# Patient Record
Sex: Female | Born: 1980 | Hispanic: No | Marital: Married | Smoking: Never smoker
Health system: Southern US, Community
[De-identification: ages and names within clinical notes are randomized; demographics above are authoritative.]

---

## 2008-08-26 ENCOUNTER — Ambulatory Visit: Payer: Self-pay | Admitting: Advanced Practice Midwife

## 2008-08-26 ENCOUNTER — Inpatient Hospital Stay (HOSPITAL_COMMUNITY): Admission: AD | Admit: 2008-08-26 | Discharge: 2008-08-26 | Payer: Self-pay | Admitting: Gynecology

## 2008-09-29 ENCOUNTER — Ambulatory Visit (HOSPITAL_COMMUNITY): Admission: RE | Admit: 2008-09-29 | Discharge: 2008-09-29 | Payer: Self-pay | Admitting: Family Medicine

## 2008-11-15 ENCOUNTER — Inpatient Hospital Stay (HOSPITAL_COMMUNITY): Admission: AD | Admit: 2008-11-15 | Discharge: 2008-11-16 | Payer: Self-pay | Admitting: Obstetrics and Gynecology

## 2008-11-21 ENCOUNTER — Ambulatory Visit: Payer: Self-pay | Admitting: Obstetrics & Gynecology

## 2008-11-26 ENCOUNTER — Ambulatory Visit: Payer: Self-pay | Admitting: Family

## 2008-11-26 ENCOUNTER — Inpatient Hospital Stay (HOSPITAL_COMMUNITY): Admission: RE | Admit: 2008-11-26 | Discharge: 2008-11-29 | Payer: Self-pay | Admitting: Obstetrics & Gynecology

## 2010-11-26 ENCOUNTER — Inpatient Hospital Stay (HOSPITAL_COMMUNITY)
Admission: AD | Admit: 2010-11-26 | Discharge: 2010-11-26 | Payer: Self-pay | Source: Home / Self Care | Attending: Obstetrics and Gynecology | Admitting: Obstetrics and Gynecology

## 2011-02-21 LAB — URINALYSIS, ROUTINE W REFLEX MICROSCOPIC
Bilirubin Urine: NEGATIVE
Glucose, UA: NEGATIVE mg/dL
Hgb urine dipstick: NEGATIVE
Protein, ur: NEGATIVE mg/dL
Specific Gravity, Urine: 1.025 (ref 1.005–1.030)
pH: 6 (ref 5.0–8.0)

## 2011-04-09 ENCOUNTER — Inpatient Hospital Stay (HOSPITAL_COMMUNITY): Payer: Medicaid Other

## 2011-04-09 ENCOUNTER — Inpatient Hospital Stay (HOSPITAL_COMMUNITY)
Admission: AD | Admit: 2011-04-09 | Discharge: 2011-04-09 | Disposition: A | Discharge status: Home / Self Care | Payer: Medicaid Other | Source: Ambulatory Visit | Attending: Obstetrics and Gynecology | Admitting: Obstetrics and Gynecology

## 2011-04-09 DIAGNOSIS — O4100X0 Oligohydramnios, unspecified trimester, not applicable or unspecified: Secondary | ICD-10-CM | POA: Insufficient documentation

## 2011-05-01 ENCOUNTER — Inpatient Hospital Stay (HOSPITAL_COMMUNITY)
Admission: AD | Admit: 2011-05-01 | Discharge: 2011-05-01 | Disposition: A | Discharge status: Home / Self Care | Payer: Medicaid Other | Source: Ambulatory Visit | Attending: Obstetrics and Gynecology | Admitting: Obstetrics and Gynecology

## 2011-05-01 DIAGNOSIS — O479 False labor, unspecified: Secondary | ICD-10-CM | POA: Insufficient documentation

## 2011-05-05 ENCOUNTER — Inpatient Hospital Stay (HOSPITAL_COMMUNITY)
Admission: RE | Admit: 2011-05-05 | Discharge: 2011-05-07 | DRG: 767 | Disposition: A | Discharge status: Home / Self Care | Payer: Medicaid Other | Source: Ambulatory Visit | Attending: Obstetrics and Gynecology | Admitting: Obstetrics and Gynecology

## 2011-05-05 DIAGNOSIS — O9903 Anemia complicating the puerperium: Secondary | ICD-10-CM | POA: Diagnosis not present

## 2011-05-05 DIAGNOSIS — D649 Anemia, unspecified: Secondary | ICD-10-CM | POA: Diagnosis not present

## 2011-05-05 LAB — CBC
HCT: 29.9 % — ABNORMAL LOW (ref 36.0–46.0)
MCH: 25.5 pg — ABNORMAL LOW (ref 26.0–34.0)
MCH: 24.9 pg — ABNORMAL LOW (ref 26.0–34.0)
MCHC: 31.7 g/dL (ref 30.0–36.0)
MCHC: 31.4 g/dL (ref 30.0–36.0)
MCV: 80.7 fL (ref 78.0–100.0)
MCV: 79.3 fL (ref 78.0–100.0)
Platelets: 125 10*3/uL — ABNORMAL LOW (ref 150–400)
RBC: 4.19 MIL/uL (ref 3.87–5.11)
WBC: 12.5 10*3/uL — ABNORMAL HIGH (ref 4.0–10.5)

## 2011-05-06 ENCOUNTER — Other Ambulatory Visit: Payer: Self-pay | Admitting: Obstetrics and Gynecology

## 2011-05-06 LAB — CBC
HCT: 23.6 % — ABNORMAL LOW (ref 36.0–46.0)
Hemoglobin: 7.6 g/dL — ABNORMAL LOW (ref 12.0–15.0)
Hemoglobin: 7.1 g/dL — ABNORMAL LOW (ref 12.0–15.0)
MCH: 25.5 pg — ABNORMAL LOW (ref 26.0–34.0)
MCH: 26.7 pg (ref 26.0–34.0)
MCHC: 32.2 g/dL (ref 30.0–36.0)
MCV: 79.2 fL (ref 78.0–100.0)
RBC: 2.98 MIL/uL — ABNORMAL LOW (ref 3.87–5.11)
RDW: 16.3 % — ABNORMAL HIGH (ref 11.5–15.5)
WBC: 11.2 10*3/uL — ABNORMAL HIGH (ref 4.0–10.5)
WBC: 12.8 10*3/uL — ABNORMAL HIGH (ref 4.0–10.5)
WBC: 13.1 10*3/uL — ABNORMAL HIGH (ref 4.0–10.5)

## 2011-05-07 LAB — CBC
HCT: 19.7 % — ABNORMAL LOW (ref 36.0–46.0)
HCT: 22.8 % — ABNORMAL LOW (ref 36.0–46.0)
Hemoglobin: 7.4 g/dL — ABNORMAL LOW (ref 12.0–15.0)
MCH: 26.3 pg (ref 26.0–34.0)
MCHC: 32.5 g/dL (ref 30.0–36.0)
MCHC: 32.5 g/dL (ref 30.0–36.0)
MCV: 80.6 fL (ref 78.0–100.0)
Platelets: 100 10*3/uL — ABNORMAL LOW (ref 150–400)
RBC: 2.43 MIL/uL — ABNORMAL LOW (ref 3.87–5.11)
RBC: 2.83 MIL/uL — ABNORMAL LOW (ref 3.87–5.11)

## 2011-05-08 LAB — CROSSMATCH
Unit division: 0
Unit division: 0

## 2011-05-14 NOTE — Op Note (Signed)
Angela Sweeney, [place] NO.:  [id number]  MEDICAL RECORD NO.:  [id number]           PATIENT TYPE:  I  LOCATION:  9104                          FACILITY:  [facility]  PHYSICIAN:  Juluis Mire, M.D.   DATE OF BIRTH:  1981-11-30  DATE OF PROCEDURE:  05/06/2011 DATE OF DISCHARGE:                              OPERATIVE REPORT   PREOPERATIVE DIAGNOSIS:  Post partum hemorrhage.  POSTOPERATIVE DIAGNOSIS:  Retained products.  OPERATIVE PROCEDURE:  Uterine exploration with uterine curettage.  SURGEON:  Juluis Mire, MD  ANESTHESIA:  Spinal.  ESTIMATED BLOOD LOSS:  200 mL.  Packs were none.  DRAINS:  Urethral Foley.  We had not given her any intraoperative blood replacement.  There were no complications.  INDICATIONS:  The patient is a 30 year old gravida 3, now para 3 female who delivered on the afternoon of the 24th at about 2:00 p.m.  She did have vaginal bleeding after delivery.  We obtained a hemoglobin, was 9.4.  This eventually respond to Pitocin, IM Methergine and Cytotec. She was sent out to the floor.  Somewhere between 10:30 to 11, it was reported that she had had continuous bleeding out on the floor.  She was not continued on Methergine as ordered.  We went to evaluate the patient.  She was having fairly continuous bleeding, although was not exactly hemorrhaging.  Her hemoglobin was repeated, it was 6.4.  She seemed stable.  We decided to proceed with a D and C for possible retained products.  The risks were explained to the patient and her husband including the risk of infection.  Risk of continued bleeding that could require transfusion with the risk of AIDS or hepatitis. Excessive bleeding could lead to the need for hysterectomy.  There is a risk of uterine perforation leading to injury to adjacent organs requiring exploratory surgery, risk of deep venous thrombosis and pulmonary embolus.  PROCEDURE:  The patient was taken to the OR.  After  satisfactory level of spinal anesthesia was obtained, the patient was placed in dorsal lithotomy position using the Allen stirrups.  Perineum and vagina cleansed out Betadine.  A Foley was placed to straight drain.  Uterine exploration with the hand did reveal multiple clots that were removed. We then placed a weighted speculum in the vaginal vault.  Cervix was grasped with a sponge stick.  We used a large curette and curetted the intrauterine cavity.  Moderate clots were obtained.  We then manually explored the uterine cavity.  She had adherent placenta anteriorly.  It may have been partially an accreta.  Portions of placental tissue were removed manually.  We continued this along with curetting at least on manual exam, the intrauterine cavity felt clear.  She had been given Hemabate as well as IM Methergine and the uterus now began to contract down well.  Further exploration of the uterus did not reveal any additional tissue manually or with curetting.  There was no signs of perforation or complications.  The speculum as well as the ring forceps were removed.  We observed the patient. She had some still trickling of blood,  but no excessive bleeding and the uterus remained firm.  She did get a second dose of Hemabate.  She was taken out of dorsal lithotomy position.  Transferred to recovery room in good condition.  Sponge, instrument, and needle count was correct by circulating nurse.     Juluis Mire, M.D.     JSM/MEDQ  D:  05/06/2011  T:  05/06/2011  Job:  [id number]  Electronically Signed by Richardean Chimera M.D. on 05/14/2011 06:16:18 AM

## 2011-05-25 ENCOUNTER — Inpatient Hospital Stay (HOSPITAL_COMMUNITY)
Admission: AD | Admit: 2011-05-25 | Discharge: 2011-05-25 | Disposition: A | Discharge status: Home / Self Care | Payer: Medicaid Other | Source: Ambulatory Visit | Attending: Obstetrics and Gynecology | Admitting: Obstetrics and Gynecology

## 2011-05-25 ENCOUNTER — Other Ambulatory Visit: Payer: Self-pay | Admitting: Obstetrics and Gynecology

## 2011-05-25 DIAGNOSIS — O9122 Nonpurulent mastitis associated with the puerperium: Secondary | ICD-10-CM

## 2011-05-25 LAB — DIFFERENTIAL
Eosinophils Relative: 1 % (ref 0–5)
Monocytes Relative: 7 % (ref 3–12)
Neutro Abs: 5 10*3/uL (ref 1.7–7.7)

## 2011-05-25 LAB — URINALYSIS, ROUTINE W REFLEX MICROSCOPIC
Bilirubin Urine: NEGATIVE
Glucose, UA: NEGATIVE mg/dL
Ketones, ur: NEGATIVE mg/dL
Leukocytes, UA: NEGATIVE
Nitrite: NEGATIVE
Protein, ur: NEGATIVE mg/dL
Specific Gravity, Urine: >1.03 — ABNORMAL HIGH (ref 1.005–1.030)
Urobilinogen, UA: 0.2 mg/dL (ref 0.0–1.0)

## 2011-05-25 LAB — CBC: MCV: 79.8 fL (ref 78.0–100.0)

## 2011-05-25 LAB — URINE MICROSCOPIC-ADD ON

## 2011-09-12 LAB — CBC
HCT: 31 — ABNORMAL LOW
Hemoglobin: 10.2 — ABNORMAL LOW
MCV: 78.8
RDW: 15.3

## 2011-09-12 LAB — COMPREHENSIVE METABOLIC PANEL
Alkaline Phosphatase: 41
BUN: 9
Creatinine, Ser: 0.34 — ABNORMAL LOW
Glucose, Bld: 89
Potassium: 3.7
Total Bilirubin: 0.5
Total Protein: 6.1

## 2011-09-12 LAB — URINALYSIS, ROUTINE W REFLEX MICROSCOPIC
Hgb urine dipstick: NEGATIVE
Protein, ur: NEGATIVE
Specific Gravity, Urine: >1.03 — ABNORMAL HIGH
Urobilinogen, UA: 0.2
pH: 6

## 2011-09-12 LAB — URINE MICROSCOPIC-ADD ON

## 2011-09-16 LAB — RPR: RPR Ser Ql: NONREACTIVE

## 2011-09-16 LAB — CBC
HCT: 23.1 % — ABNORMAL LOW (ref 36.0–46.0)
Hemoglobin: 11.6 g/dL — ABNORMAL LOW (ref 12.0–15.0)
Hemoglobin: 7.9 g/dL — CL (ref 12.0–15.0)
MCV: 78.9 fL (ref 78.0–100.0)
RBC: 4.46 MIL/uL (ref 3.87–5.11)
WBC: 10.1 10*3/uL (ref 4.0–10.5)
WBC: 8.6 10*3/uL (ref 4.0–10.5)

## 2012-09-29 ENCOUNTER — Emergency Department (HOSPITAL_COMMUNITY)
Admission: EM | Admit: 2012-09-29 | Discharge: 2012-09-29 | Disposition: A | Discharge status: Home / Self Care | Payer: Medicaid Other | Attending: Emergency Medicine | Admitting: Emergency Medicine

## 2012-09-29 ENCOUNTER — Encounter (HOSPITAL_COMMUNITY): Payer: Self-pay | Admitting: Emergency Medicine

## 2012-09-29 DIAGNOSIS — J029 Acute pharyngitis, unspecified: Secondary | ICD-10-CM

## 2012-09-29 MED ORDER — AMOXICILLIN 500 MG PO CAPS
1000.0000 mg | ORAL_CAPSULE | Freq: Once | ORAL | Status: AC
  Administered 2012-09-29: 1000 mg via ORAL
  Filled 2012-09-29: qty 2

## 2012-09-29 MED ORDER — PREDNISONE 20 MG PO TABS
60.0000 mg | ORAL_TABLET | Freq: Once | ORAL | Status: AC
  Administered 2012-09-29: 60 mg via ORAL
  Filled 2012-09-29: qty 3

## 2012-09-29 MED ORDER — OXYCODONE-ACETAMINOPHEN 5-325 MG PO TABS: 1.0000 | ORAL_TABLET | ORAL | Status: DC | PRN

## 2012-09-29 MED ORDER — PREDNISONE 50 MG PO TABS: 50.0000 mg | ORAL_TABLET | Freq: Every day | ORAL | Status: DC

## 2012-09-29 MED ORDER — AMOXICILLIN 500 MG PO CAPS: 1000.0000 mg | ORAL_CAPSULE | Freq: Two times a day (BID) | ORAL | Status: DC

## 2012-09-29 MED ORDER — AMOXICILLIN 500 MG PO CAPS
500.0000 mg | ORAL_CAPSULE | Freq: Once | ORAL | Status: AC
  Administered 2012-09-29: 500 mg via ORAL
  Filled 2012-09-29: qty 1

## 2012-09-29 NOTE — ED Notes (Signed)
Pt has taken ibuprofen, denies cough, fevers, nausea, vomiting, diarrhea

## 2012-09-29 NOTE — ED Notes (Signed)
Pt c/o sore throat x 1 week

## 2012-09-29 NOTE — ED Provider Notes (Signed)
History  This chart was scribed for Dione Booze, MD by Bennett Scrape. This patient was seen in room TR06C/TR06C and the patient's care was started at 11:40AM.  CSN: [id number]  Arrival date & time 09/29/12  1045   First MD Initiated Contact with Patient 09/29/12 1140      Chief Complaint  Patient presents with  . Sore Throat    The history is provided by the patient. No language interpreter was used.    Nyhla Sweeney is a 31 y.o. female who presents to the Emergency Department complaining of two weeks of gradual onset, gradually worsening, constant sore throat with associated left otalgia and 2 days of left sided tongue swelling. She rates her pain an 8 out of 10 and states that the pain is worse with swallowing. She reports taking acetaminophen with mild improvement in her pain. She denies fever, chills, nausea, emesis and cough as associated symptoms. She does not have a h/o chronic medical conditions and denies smoking and alcohol use.  No PCP currently.  History reviewed. No pertinent past medical history.  History reviewed. No pertinent past surgical history.  History reviewed. No pertinent family history.  History  Substance Use Topics  . Smoking status: Never Smoker   . Smokeless tobacco: Not on file  . Alcohol Use: No    No OB history provided.  Review of Systems  Constitutional: Negative for fever and chills.  HENT: Positive for ear pain (left) and sore throat. Negative for congestion.   Respiratory: Negative for cough.   All other systems reviewed and are negative.    Allergies  Review of patient's allergies indicates no known allergies.  Home Medications   Current Outpatient Rx  Name Route Sig Dispense Refill  . ASPIRIN-ACETAMINOPHEN-CAFFEINE 250-250-65 MG PO TABS Oral Take 1 tablet by mouth daily as needed. migraine    . FERROUS SULFATE 325 (65 FE) MG PO TABS Oral Take 325 mg by mouth daily with breakfast.    . IBUPROFEN 200 MG PO TABS Oral Take 600  mg by mouth 2 (two) times daily as needed. pain    . ADULT MULTIVITAMIN W/MINERALS CH Oral Take 1 tablet by mouth daily.      Triage Vitals: BP 111/65  Pulse 83  Temp 99 F (37.2 C) (Oral)  Resp 20  SpO2 100%  Physical Exam  Nursing note and vitals reviewed. Constitutional: She is oriented to person, place, and time. She appears well-developed and well-nourished. No distress.  HENT:  Head: Normocephalic and atraumatic.       Mild edema of the uvula, normal phonation, no difficulty with secretions, TMs are normal bilaterally  Eyes: Conjunctivae normal and EOM are normal.  Neck: Neck supple. No tracheal deviation present.  Cardiovascular: Normal rate.   Pulmonary/Chest: Effort normal. No respiratory distress.  Musculoskeletal: Normal range of motion.  Lymphadenopathy:    She has no cervical adenopathy.  Neurological: She is alert and oriented to person, place, and time.  Skin: Skin is warm and dry.  Psychiatric: She has a normal mood and affect. Her behavior is normal.    ED Course  Procedures (including critical care time)  DIAGNOSTIC STUDIES: Oxygen Saturation is 100% on room air, normal by my interpretation.    COORDINATION OF CARE: 11:44AM-Informed pt of negative strep test. Discussed discharge plan which includes Amoxicillin, percocet and prednisone with pt at bedside and pt agreed to plan.  12:00PM-Ordered 60 mg prednisone and 1,000 mg Amoxicillin   Results for orders placed during the  hospital encounter of 09/29/12  RAPID STREP SCREEN      Component Value Range   Streptococcus, Group A Screen (Direct) NEGATIVE  NEGATIVE    1. Pharyngitis       MDM  Pharyngitis oh which has been present for 2 weeks. Because of longevity of symptoms, she'll be started on antibiotics in spite of negative strep infection. It should be noted, that there are other bacteria that can cause pharyngitis besides strep. She'll be sent home with prescription for prednisone, amoxicillin,  and Percocet.   I personally performed the services described in this documentation, which was scribed in my presence. The recorded information has been reviewed and considered.         Dione Booze, MD 09/29/12 1330

## 2012-11-13 ENCOUNTER — Encounter (HOSPITAL_COMMUNITY): Payer: Self-pay | Admitting: *Deleted

## 2012-11-13 ENCOUNTER — Emergency Department (HOSPITAL_COMMUNITY)
Admission: EM | Admit: 2012-11-13 | Discharge: 2012-11-13 | Disposition: A | Discharge status: Home / Self Care | Payer: Medicaid Other | Attending: Emergency Medicine | Admitting: Emergency Medicine

## 2012-11-13 DIAGNOSIS — N390 Urinary tract infection, site not specified: Secondary | ICD-10-CM | POA: Insufficient documentation

## 2012-11-13 DIAGNOSIS — R51 Headache: Secondary | ICD-10-CM | POA: Insufficient documentation

## 2012-11-13 DIAGNOSIS — R5383 Other fatigue: Secondary | ICD-10-CM | POA: Insufficient documentation

## 2012-11-13 DIAGNOSIS — R110 Nausea: Secondary | ICD-10-CM | POA: Insufficient documentation

## 2012-11-13 DIAGNOSIS — Z3202 Encounter for pregnancy test, result negative: Secondary | ICD-10-CM | POA: Insufficient documentation

## 2012-11-13 DIAGNOSIS — Z79899 Other long term (current) drug therapy: Secondary | ICD-10-CM | POA: Insufficient documentation

## 2012-11-13 DIAGNOSIS — R5381 Other malaise: Secondary | ICD-10-CM | POA: Insufficient documentation

## 2012-11-13 DIAGNOSIS — D649 Anemia, unspecified: Secondary | ICD-10-CM | POA: Insufficient documentation

## 2012-11-13 LAB — URINALYSIS, ROUTINE W REFLEX MICROSCOPIC
Glucose, UA: NEGATIVE mg/dL
Hgb urine dipstick: NEGATIVE
Specific Gravity, Urine: 1.033 — ABNORMAL HIGH (ref 1.005–1.030)
pH: 5 (ref 5.0–8.0)

## 2012-11-13 LAB — CBC WITH DIFFERENTIAL/PLATELET
Eosinophils Relative: 2 % (ref 0–5)
HCT: 32.1 % — ABNORMAL LOW (ref 36.0–46.0)
Hemoglobin: 10.1 g/dL — ABNORMAL LOW (ref 12.0–15.0)
Lymphocytes Relative: 38 % (ref 12–46)
Lymphs Abs: 2.5 10*3/uL (ref 0.7–4.0)
MCV: 72.1 fL — ABNORMAL LOW (ref 78.0–100.0)
Monocytes Absolute: 0.4 10*3/uL (ref 0.1–1.0)
Platelets: 197 10*3/uL (ref 150–400)
RBC: 4.45 MIL/uL (ref 3.87–5.11)
WBC: 6.4 10*3/uL (ref 4.0–10.5)

## 2012-11-13 LAB — URINE MICROSCOPIC-ADD ON

## 2012-11-13 LAB — BASIC METABOLIC PANEL
CO2: 26 mEq/L (ref 19–32)
Calcium: 9.3 mg/dL (ref 8.4–10.5)
Glucose, Bld: 84 mg/dL (ref 70–99)
Sodium: 137 mEq/L (ref 135–145)

## 2012-11-13 MED ORDER — CENTRUM PO CHEW: 1.0000 | CHEWABLE_TABLET | Freq: Every day | ORAL | Status: AC

## 2012-11-13 MED ORDER — SULFAMETHOXAZOLE-TRIMETHOPRIM 800-160 MG PO TABS: 1.0000 | ORAL_TABLET | Freq: Two times a day (BID) | ORAL | Status: AC

## 2012-11-13 MED ORDER — ACETAMINOPHEN 325 MG PO TABS
650.0000 mg | ORAL_TABLET | Freq: Once | ORAL | Status: AC
  Administered 2012-11-13: 650 mg via ORAL
  Filled 2012-11-13: qty 2

## 2012-11-13 NOTE — ED Provider Notes (Signed)
History     CSN: [id number]  Arrival date & time 11/13/12  1735   First MD Initiated Contact with Patient 11/13/12 1929      Chief Complaint  Patient presents with  . Dizziness  . Headache  . Nausea    (Consider location/radiation/quality/duration/timing/severity/associated sxs/prior treatment) HPI Comments: Patient is a 31 year old female with no significant past medical history who presents with a 3 week history of fatigue. Patient reports the fatigue starting gradually and progressively worsening since the onset. No alleviating/aggravating factors. Patient reports associated occasional dizziness, headache, and nausea. Her LMP started yesterday. She has experienced this before and was told she has low iron. She was given iron supplements in the past which provided some improvement. She does not have a PCP.    History reviewed. No pertinent past medical history.  History reviewed. No pertinent past surgical history.  No family history on file.  History  Substance Use Topics  . Smoking status: Never Smoker   . Smokeless tobacco: Not on file  . Alcohol Use: No    OB History    Grav Para Term Preterm Abortions TAB SAB Ect Mult Living                  Review of Systems  Constitutional: Positive for fatigue.  Gastrointestinal: Positive for nausea.  Neurological: Positive for headaches.  All other systems reviewed and are negative.    Allergies  Review of patient's allergies indicates no known allergies.  Home Medications   Current Outpatient Rx  Name  Route  Sig  Dispense  Refill  . ASPIRIN-ACETAMINOPHEN-CAFFEINE 250-250-65 MG PO TABS   Oral   Take 1 tablet by mouth daily as needed. migraine         . FERROUS SULFATE 325 (65 FE) MG PO TABS   Oral   Take 325 mg by mouth daily with breakfast.         . IBUPROFEN 200 MG PO TABS   Oral   Take 600 mg by mouth 2 (two) times daily as needed. pain         . ADULT MULTIVITAMIN W/MINERALS CH   Oral   Take 1  tablet by mouth daily.           BP 91/58  Pulse 77  Temp 97.6 F (36.4 C) (Oral)  Resp 18  SpO2 99%  LMP 11/05/2012  Physical Exam  Nursing note and vitals reviewed. Constitutional: She is oriented to person, place, and time. She appears well-developed and well-nourished. No distress.  HENT:  Head: Normocephalic and atraumatic.  Mouth/Throat: No oropharyngeal exudate.  Eyes: Conjunctivae normal and EOM are normal. Pupils are equal, round, and reactive to light. No scleral icterus.       No pallor of conjunctiva noted.   Neck: Normal range of motion. Neck supple.  Cardiovascular: Normal rate and regular rhythm.  Exam reveals no gallop and no friction rub.   No murmur heard. Pulmonary/Chest: Effort normal and breath sounds normal. She has no wheezes. She has no rales. She exhibits no tenderness.  Abdominal: Soft. She exhibits no distension. There is no tenderness. There is no rebound and no guarding.  Musculoskeletal: Normal range of motion.  Neurological: She is alert and oriented to person, place, and time. Coordination normal.       Speech is goal-oriented. Moves limbs without ataxia.   Skin: Skin is warm and dry. She is not diaphoretic.  Psychiatric: She has a normal mood and affect.  Her behavior is normal.    ED Course  Procedures (including critical care time)  Labs Reviewed  URINALYSIS, ROUTINE W REFLEX MICROSCOPIC - Abnormal; Notable for the following:    APPearance CLOUDY (*)     Specific Gravity, Urine 1.033 (*)     Leukocytes, UA MODERATE (*)     All other components within normal limits  CBC WITH DIFFERENTIAL - Abnormal; Notable for the following:    Hemoglobin 10.1 (*)     HCT 32.1 (*)     MCV 72.1 (*)     MCH 22.7 (*)     RDW 16.9 (*)     All other components within normal limits  URINE MICROSCOPIC-ADD ON - Abnormal; Notable for the following:    Squamous Epithelial / LPF MANY (*)     Bacteria, UA MANY (*)     All other components within normal limits   BASIC METABOLIC PANEL  POCT PREGNANCY, URINE  URINE CULTURE   No results found.   1. UTI (urinary tract infection)   2. Anemia       MDM  8:09 PM Labs show anemia and UTI. Patient will be discharged with Bactrim for UTI, tylenol for headache, and iron supplements. No further evaluation needed at this time. I will recommend a PCP for the patient to follow up with.         Emilia Beck, PA-C 11/15/12 1212

## 2012-11-13 NOTE — ED Notes (Signed)
Pt is here with dizziness for 3 weeks, headache, fatigue,and nausea.  Constipation.  Last menstral yesterday

## 2012-11-15 LAB — URINE CULTURE

## 2012-11-16 NOTE — ED Notes (Signed)
+   Urine Chart appended per protocol MD. 

## 2012-11-16 NOTE — ED Provider Notes (Signed)
Medical screening examination/treatment/procedure(s) were performed by non-physician practitioner and as supervising physician I was immediately available for consultation/collaboration.   Charles B. Bernette Mayers, MD 11/16/12 [phone]

## 2012-11-18 ENCOUNTER — Telehealth (HOSPITAL_COMMUNITY): Payer: Self-pay | Admitting: Emergency Medicine

## 2012-11-18 NOTE — ED Notes (Signed)
Unable to contact patient via phone. Sent letter. °

## 2012-11-18 NOTE — ED Notes (Signed)
Chart returned from EDP office. Prescribed Amoxicillin 500 mg PO BID x 10 days. #30. Prescribed by Teressa Lower NP.

## 2012-12-16 NOTE — ED Notes (Signed)
Unable to contact patient via phone or letter. Chart sent to Medical Records. °

## 2012-12-17 ENCOUNTER — Emergency Department (HOSPITAL_COMMUNITY)
Admission: EM | Admit: 2012-12-17 | Discharge: 2012-12-17 | Discharge status: Against Medical Advice | Payer: Medicaid Other | Attending: Emergency Medicine | Admitting: Emergency Medicine

## 2012-12-17 ENCOUNTER — Encounter (HOSPITAL_COMMUNITY): Payer: Self-pay | Admitting: Emergency Medicine

## 2012-12-17 DIAGNOSIS — R509 Fever, unspecified: Secondary | ICD-10-CM | POA: Insufficient documentation

## 2012-12-17 DIAGNOSIS — J029 Acute pharyngitis, unspecified: Secondary | ICD-10-CM | POA: Insufficient documentation

## 2012-12-17 LAB — RAPID STREP SCREEN (MED CTR MEBANE ONLY): Streptococcus, Group A Screen (Direct): POSITIVE — AB

## 2012-12-17 MED ORDER — ACETAMINOPHEN 325 MG PO TABS
ORAL_TABLET | ORAL | Status: AC
  Filled 2012-12-17: qty 2

## 2012-12-17 MED ORDER — ACETAMINOPHEN 325 MG PO TABS
ORAL_TABLET | ORAL | Status: AC
  Filled 2012-12-17: qty 1

## 2012-12-17 MED ORDER — ACETAMINOPHEN 325 MG PO TABS
650.0000 mg | ORAL_TABLET | Freq: Once | ORAL | Status: AC
  Administered 2012-12-17: 650 mg via ORAL

## 2012-12-17 NOTE — ED Notes (Signed)
Called x2 in waiting room; no answer.  Will try again.

## 2012-12-17 NOTE — ED Notes (Signed)
Pt here with fever and sore throat x 2 days.  

## 2014-12-04 ENCOUNTER — Encounter (HOSPITAL_COMMUNITY): Payer: Self-pay | Admitting: *Deleted

## 2014-12-04 ENCOUNTER — Emergency Department (INDEPENDENT_AMBULATORY_CARE_PROVIDER_SITE_OTHER)
Admission: EM | Admit: 2014-12-04 | Discharge: 2014-12-04 | Disposition: A | Discharge status: Home / Self Care | Payer: Medicaid Other | Source: Home / Self Care | Attending: Emergency Medicine | Admitting: Emergency Medicine

## 2014-12-04 DIAGNOSIS — J029 Acute pharyngitis, unspecified: Secondary | ICD-10-CM

## 2014-12-04 DIAGNOSIS — J069 Acute upper respiratory infection, unspecified: Secondary | ICD-10-CM

## 2014-12-04 LAB — POCT RAPID STREP A: STREPTOCOCCUS, GROUP A SCREEN (DIRECT): NEGATIVE

## 2014-12-04 NOTE — ED Notes (Signed)
Pt   Reports  Symptoms  Of  Congestion    /  Cough         Nasal  stuffyness     With  A  sorethroat  For  sev  Days     she is sitting  Upright on the  Exam table  Speaking in  Complete  sentances  And  Appearing in no acute  Distress

## 2014-12-04 NOTE — ED Provider Notes (Signed)
CSN: [id number]     Arrival date & time 12/04/14  1332 History   First MD Initiated Contact with Patient 12/04/14 1344     Chief Complaint  Patient presents with  . URI   (Consider location/radiation/quality/duration/timing/severity/associated sxs/prior Treatment) Patient is a 33 y.o. female presenting with URI. The history is provided by the patient.  URI Presenting symptoms: congestion, cough, rhinorrhea and sore throat   Presenting symptoms: no fever   Severity:  Mild Onset quality:  Gradual Duration:  6 days Timing:  Constant Chronicity:  New   History reviewed. No pertinent past medical history. History reviewed. No pertinent past surgical history. No family history on file. History  Substance Use Topics  . Smoking status: Never Smoker   . Smokeless tobacco: Not on file  . Alcohol Use: No   OB History    No data available     Review of Systems  Constitutional: Negative for fever.  HENT: Positive for congestion, rhinorrhea and sore throat.   Respiratory: Positive for cough. Negative for shortness of breath.   Cardiovascular: Negative.   Gastrointestinal: Negative.   Skin: Negative.     Allergies  Review of patient's allergies indicates no known allergies.  Home Medications   Prior to Admission medications   Medication Sig Start Date End Date Taking? Authorizing Provider  aspirin-acetaminophen-caffeine (EXCEDRIN MIGRAINE) [phone] MG per tablet Take 1 tablet by mouth daily as needed. migraine    Historical Provider, MD  ferrous sulfate 325 (65 FE) MG tablet Take 325 mg by mouth daily with breakfast.    Historical Provider, MD  ibuprofen (ADVIL,MOTRIN) 200 MG tablet Take 600 mg by mouth 2 (two) times daily as needed. pain    Historical Provider, MD  Multiple Vitamin (MULTIVITAMIN WITH MINERALS) TABS Take 1 tablet by mouth daily.    Historical Provider, MD  multivitamin-iron-minerals-folic acid (CENTRUM) chewable tablet Chew 1 tablet by mouth daily. 11/13/12    Kaitlyn Szekalski, PA-C   BP 106/64 mmHg  Pulse 82  Temp(Src) 97.8 F (36.6 C) (Oral)  Resp 16  SpO2 97% Physical Exam  Constitutional: She is oriented to person, place, and time. She appears well-developed and well-nourished. No distress.  HENT:  Head: Normocephalic and atraumatic.  Right Ear: Hearing, tympanic membrane, external ear and ear canal normal.  Left Ear: Hearing, tympanic membrane, external ear and ear canal normal.  Nose: Nose normal.  Mouth/Throat: Uvula is midline and mucous membranes are normal. Posterior oropharyngeal erythema present.  Eyes: Conjunctivae are normal. No scleral icterus.  Neck: Normal range of motion. Neck supple.  Cardiovascular: Normal rate, regular rhythm and normal heart sounds.   Pulmonary/Chest: Effort normal and breath sounds normal.  Musculoskeletal: Normal range of motion.  Lymphadenopathy:    She has no cervical adenopathy.  Neurological: She is alert and oriented to person, place, and time.  Skin: Skin is warm and dry.  Psychiatric: She has a normal mood and affect. Her behavior is normal.  Nursing note and vitals reviewed.   ED Course  Procedures (including critical care time) Labs Review Labs Reviewed  POCT RAPID STREP A ([facility] CARE ONLY)    Imaging Review No results found.   MDM   1. URI (upper respiratory infection)   2. Pharyngitis   Rapid strep negative. Exam consistent with mild URI. Advised symptomatic care at home.   Ria ClockJennifer Lee H Starlena Beil, [place]PA 12/04/14 1501

## 2014-12-04 NOTE — Discharge Instructions (Signed)
Rapid strep test was negative. tylenol as directed on packaging for pain or fever Delsym as directed on packaging for cough. Follow up with your doctor if no improvement over the next 3-4 days.  Pharyngitis Pharyngitis is redness, pain, and swelling (inflammation) of your pharynx.  CAUSES  Pharyngitis is usually caused by infection. Most of the time, these infections are from viruses (viral) and are part of a cold. However, sometimes pharyngitis is caused by bacteria (bacterial). Pharyngitis can also be caused by allergies. Viral pharyngitis may be spread from person to person by coughing, sneezing, and personal items or utensils (cups, forks, spoons, toothbrushes). Bacterial pharyngitis may be spread from person to person by more intimate contact, such as kissing.  SIGNS AND SYMPTOMS  Symptoms of pharyngitis include:   Sore throat.   Tiredness (fatigue).   Low-grade fever.   Headache.  Joint pain and muscle aches.  Skin rashes.  Swollen lymph nodes.  Plaque-like film on throat or tonsils (often seen with bacterial pharyngitis). DIAGNOSIS  Your health care provider will ask you questions about your illness and your symptoms. Your medical history, along with a physical exam, is often all that is needed to diagnose pharyngitis. Sometimes, a rapid strep test is done. Other lab tests may also be done, depending on the suspected cause.  TREATMENT  Viral pharyngitis will usually get better in 3-4 days without the use of medicine. Bacterial pharyngitis is treated with medicines that kill germs (antibiotics).  HOME CARE INSTRUCTIONS   Drink enough water and fluids to keep your urine clear or pale yellow.   Only take over-the-counter or prescription medicines as directed by your health care provider:   If you are prescribed antibiotics, make sure you finish them even if you start to feel better.   Do not take aspirin.   Get lots of rest.   Gargle with 8 oz of salt water (  tsp of salt per 1 qt of water) as often as every 1-2 hours to soothe your throat.   Throat lozenges (if you are not at risk for choking) or sprays may be used to soothe your throat. SEEK MEDICAL CARE IF:   You have large, tender lumps in your neck.  You have a rash.  You cough up green, yellow-brown, or bloody spit. SEEK IMMEDIATE MEDICAL CARE IF:   Your neck becomes stiff.  You drool or are unable to swallow liquids.  You vomit or are unable to keep medicines or liquids down.  You have severe pain that does not go away with the use of recommended medicines.  You have trouble breathing (not caused by a stuffy nose). MAKE SURE YOU:   Understand these instructions.  Will watch your condition.  Will get help right away if you are not doing well or get worse. Document Released: 11/28/2005 Document Revised: 09/18/2013 Document Reviewed: 08/05/2013 [facility] Patient Information 2015 [place], [place]. This information is not intended to replace advice given to you by your health care provider. Make sure you discuss any questions you have with your health care provider.  Salt Water Gargle This solution will help make your mouth and throat feel better. HOME CARE INSTRUCTIONS   Mix 1 teaspoon of salt in 8 ounces of warm water.  Gargle with this solution as much or often as you need or as directed. Swish and gargle gently if you have any sores or wounds in your mouth.  Do not swallow this mixture. Document Released: 09/01/2004 Document Revised: 02/20/2012 Document Reviewed: 01/23/2009  ExitCare Patient Information 2015 [place], [place]. This information is not intended to replace advice given to you by your health care provider. Make sure you discuss any questions you have with your health care provider.  Sore Throat A sore throat is pain, burning, irritation, or scratchiness of the throat. There is often pain or tenderness when swallowing or talking. A sore throat may be accompanied by  other symptoms, such as coughing, sneezing, fever, and swollen neck glands. A sore throat is often the first sign of another sickness, such as a cold, flu, strep throat, or mononucleosis (commonly known as mono). Most sore throats go away without medical treatment. CAUSES  The most common causes of a sore throat include:  A viral infection, such as a cold, flu, or mono.  A bacterial infection, such as strep throat, tonsillitis, or whooping cough.  Seasonal allergies.  Dryness in the air.  Irritants, such as smoke or pollution.  Gastroesophageal reflux disease (GERD). HOME CARE INSTRUCTIONS   Only take over-the-counter medicines as directed by your caregiver.  Drink enough fluids to keep your urine clear or pale yellow.  Rest as needed.  Try using throat sprays, lozenges, or sucking on hard candy to ease any pain (if older than 4 years or as directed).  Sip warm liquids, such as broth, herbal tea, or warm water with honey to relieve pain temporarily. You may also eat or drink cold or frozen liquids such as frozen ice pops.  Gargle with salt water (mix 1 tsp salt with 8 oz of water).  Do not smoke and avoid secondhand smoke.  Put a cool-mist humidifier in your bedroom at night to moisten the air. You can also turn on a hot shower and sit in the bathroom with the door closed for 5-10 minutes. SEEK IMMEDIATE MEDICAL CARE IF:  You have difficulty breathing.  You are unable to swallow fluids, soft foods, or your saliva.  You have increased swelling in the throat.  Your sore throat does not get better in 7 days.  You have nausea and vomiting.  You have a fever or persistent symptoms for more than 2-3 days.  You have a fever and your symptoms suddenly get worse. MAKE SURE YOU:   Understand these instructions.  Will watch your condition.  Will get help right away if you are not doing well or get worse. Document Released: 01/05/2005 Document Revised: 11/14/2012 Document  Reviewed: 08/05/2012 [facility] Patient Information 2015 [place], [place]. This information is not intended to replace advice given to you by your health care provider. Make sure you discuss any questions you have with your health care provider.  Upper Respiratory Infection, Adult An upper respiratory infection (URI) is also sometimes known as the common cold. The upper respiratory tract includes the nose, sinuses, throat, trachea, and bronchi. Bronchi are the airways leading to the lungs. Most people improve within 1 week, but symptoms can last up to 2 weeks. A residual cough may last even longer.  CAUSES Many different viruses can infect the tissues lining the upper respiratory tract. The tissues become irritated and inflamed and often become very moist. Mucus production is also common. A cold is contagious. You can easily spread the virus to others by oral contact. This includes kissing, sharing a glass, coughing, or sneezing. Touching your mouth or nose and then touching a surface, which is then touched by another person, can also spread the virus. SYMPTOMS  Symptoms typically develop 1 to 3 days after you come in contact  with a cold virus. Symptoms vary from person to person. They may include:  Runny nose.  Sneezing.  Nasal congestion.  Sinus irritation.  Sore throat.  Loss of voice (laryngitis).  Cough.  Fatigue.  Muscle aches.  Loss of appetite.  Headache.  Low-grade fever. DIAGNOSIS  You might diagnose your own cold based on familiar symptoms, since most people get a cold 2 to 3 times a year. Your caregiver can confirm this based on your exam. Most importantly, your caregiver can check that your symptoms are not due to another disease such as strep throat, sinusitis, pneumonia, asthma, or epiglottitis. Blood tests, throat tests, and X-rays are not necessary to diagnose a common cold, but they may sometimes be helpful in excluding other more serious diseases. Your caregiver will  decide if any further tests are required. RISKS AND COMPLICATIONS  You may be at risk for a more severe case of the common cold if you smoke cigarettes, have chronic heart disease (such as heart failure) or lung disease (such as asthma), or if you have a weakened immune system. The very young and very old are also at risk for more serious infections. Bacterial sinusitis, middle ear infections, and bacterial pneumonia can complicate the common cold. The common cold can worsen asthma and chronic obstructive pulmonary disease (COPD). Sometimes, these complications can require emergency medical care and may be life-threatening. PREVENTION  The best way to protect against getting a cold is to practice good hygiene. Avoid oral or hand contact with people with cold symptoms. Wash your hands often if contact occurs. There is no clear evidence that vitamin C, vitamin E, echinacea, or exercise reduces the chance of developing a cold. However, it is always recommended to get plenty of rest and practice good nutrition. TREATMENT  Treatment is directed at relieving symptoms. There is no cure. Antibiotics are not effective, because the infection is caused by a virus, not by bacteria. Treatment may include:  Increased fluid intake. Sports drinks offer valuable electrolytes, sugars, and fluids.  Breathing heated mist or steam (vaporizer or shower).  Eating chicken soup or other clear broths, and maintaining good nutrition.  Getting plenty of rest.  Using gargles or lozenges for comfort.  Controlling fevers with ibuprofen or acetaminophen as directed by your caregiver.  Increasing usage of your inhaler if you have asthma. Zinc gel and zinc lozenges, taken in the first 24 hours of the common cold, can shorten the duration and lessen the severity of symptoms. Pain medicines may help with fever, muscle aches, and throat pain. A variety of non-prescription medicines are available to treat congestion and runny nose.  Your caregiver can make recommendations and may suggest nasal or lung inhalers for other symptoms.  HOME CARE INSTRUCTIONS   Only take over-the-counter or prescription medicines for pain, discomfort, or fever as directed by your caregiver.  Use a warm mist humidifier or inhale steam from a shower to increase air moisture. This may keep secretions moist and make it easier to breathe.  Drink enough water and fluids to keep your urine clear or pale yellow.  Rest as needed.  Return to work when your temperature has returned to normal or as your caregiver advises. You may need to stay home longer to avoid infecting others. You can also use a face mask and careful hand washing to prevent spread of the virus. SEEK MEDICAL CARE IF:   After the first few days, you feel you are getting worse rather than better.  You need  your caregiver's advice about medicines to control symptoms.  You develop chills, worsening shortness of breath, or brown or red sputum. These may be signs of pneumonia.  You develop yellow or brown nasal discharge or pain in the face, especially when you bend forward. These may be signs of sinusitis.  You develop a fever, swollen neck glands, pain with swallowing, or white areas in the back of your throat. These may be signs of strep throat. SEEK IMMEDIATE MEDICAL CARE IF:   You have a fever.  You develop severe or persistent headache, ear pain, sinus pain, or chest pain.  You develop wheezing, a prolonged cough, cough up blood, or have a change in your usual mucus (if you have chronic lung disease).  You develop sore muscles or a stiff neck. Document Released: 05/24/2001 Document Revised: 02/20/2012 Document Reviewed: 03/05/2014 [facility] Patient Information 2015 [place], [place]. This information is not intended to replace advice given to you by your health care provider. Make sure you discuss any questions you have with your health care provider.

## 2014-12-06 LAB — CULTURE, GROUP A STREP

## 2020-04-24 ENCOUNTER — Other Ambulatory Visit: Payer: Self-pay | Admitting: Family Medicine

## 2020-04-24 ENCOUNTER — Other Ambulatory Visit (HOSPITAL_COMMUNITY)
Admission: RE | Admit: 2020-04-24 | Discharge: 2020-04-24 | Disposition: A | Discharge status: Home / Self Care | Payer: No Typology Code available for payment source | Source: Ambulatory Visit | Attending: Family Medicine | Admitting: Family Medicine

## 2020-04-24 DIAGNOSIS — Z01411 Encounter for gynecological examination (general) (routine) with abnormal findings: Secondary | ICD-10-CM | POA: Diagnosis not present

## 2020-04-28 LAB — CYTOLOGY - PAP
Comment: NEGATIVE
Diagnosis: NEGATIVE
High risk HPV: NEGATIVE
